# Patient Record
Sex: Male | Born: 1975 | Race: White | Hispanic: No | Marital: Married | Smoking: Never smoker
Health system: Southern US, Community
[De-identification: ages and names within clinical notes are randomized; demographics above are authoritative.]

## PROBLEM LIST (undated history)

## (undated) DIAGNOSIS — F329 Major depressive disorder, single episode, unspecified: Secondary | ICD-10-CM

## (undated) DIAGNOSIS — B0223 Postherpetic polyneuropathy: Secondary | ICD-10-CM

## (undated) DIAGNOSIS — F32A Depression, unspecified: Secondary | ICD-10-CM

---
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
## 2006-03-01 DIAGNOSIS — B0223 Postherpetic polyneuropathy: Secondary | ICD-10-CM

## 2006-03-01 HISTORY — DX: Postherpetic polyneuropathy: B02.23

## 2008-06-28 ENCOUNTER — Emergency Department (HOSPITAL_COMMUNITY): Admission: EM | Admit: 2008-06-28 | Discharge: 2008-06-28 | Payer: Self-pay | Admitting: Emergency Medicine

## 2008-07-15 ENCOUNTER — Encounter: Admission: RE | Admit: 2008-07-15 | Discharge: 2008-07-15 | Payer: Self-pay | Admitting: Internal Medicine

## 2010-08-17 ENCOUNTER — Emergency Department (HOSPITAL_COMMUNITY)
Admission: EM | Admit: 2010-08-17 | Discharge: 2010-08-18 | Disposition: A | Discharge status: Home / Self Care | Payer: PRIVATE HEALTH INSURANCE | Attending: Emergency Medicine | Admitting: Emergency Medicine

## 2010-08-17 DIAGNOSIS — I8010 Phlebitis and thrombophlebitis of unspecified femoral vein: Secondary | ICD-10-CM | POA: Insufficient documentation

## 2010-08-17 DIAGNOSIS — Z86718 Personal history of other venous thrombosis and embolism: Secondary | ICD-10-CM | POA: Insufficient documentation

## 2011-12-29 ENCOUNTER — Emergency Department (HOSPITAL_COMMUNITY)
Admission: EM | Admit: 2011-12-29 | Discharge: 2011-12-30 | Disposition: A | Discharge status: Home / Self Care | Payer: Self-pay | Attending: Emergency Medicine | Admitting: Emergency Medicine

## 2011-12-29 ENCOUNTER — Encounter (HOSPITAL_COMMUNITY): Payer: Self-pay

## 2011-12-29 DIAGNOSIS — H109 Unspecified conjunctivitis: Secondary | ICD-10-CM | POA: Insufficient documentation

## 2011-12-29 DIAGNOSIS — K1370 Unspecified lesions of oral mucosa: Secondary | ICD-10-CM | POA: Insufficient documentation

## 2011-12-29 DIAGNOSIS — R6884 Jaw pain: Secondary | ICD-10-CM | POA: Insufficient documentation

## 2011-12-29 DIAGNOSIS — Z79899 Other long term (current) drug therapy: Secondary | ICD-10-CM | POA: Insufficient documentation

## 2011-12-29 DIAGNOSIS — F329 Major depressive disorder, single episode, unspecified: Secondary | ICD-10-CM | POA: Insufficient documentation

## 2011-12-29 DIAGNOSIS — F3289 Other specified depressive episodes: Secondary | ICD-10-CM | POA: Insufficient documentation

## 2011-12-29 DIAGNOSIS — B029 Zoster without complications: Secondary | ICD-10-CM | POA: Insufficient documentation

## 2011-12-29 HISTORY — DX: Major depressive disorder, single episode, unspecified: F32.9

## 2011-12-29 HISTORY — DX: Postherpetic polyneuropathy: B02.23

## 2011-12-29 HISTORY — DX: Depression, unspecified: F32.A

## 2011-12-29 NOTE — ED Provider Notes (Signed)
History     CSN: [id number]  Arrival date & time 12/29/11  1951   First MD Initiated Contact with Patient 12/29/11 2312      Chief Complaint  Patient presents with  . Jaw Pain    right side  . Conjunctivitis    right eye  . Mouth Lesions    right bottom teeth    (Consider location/radiation/quality/duration/timing/severity/associated sxs/prior treatment) The history is provided by the patient. No language interpreter was used.   Cc: Patient reports pain in his right jaw for 4 days with  blisterlike lesions inside his mouth on the right cheek.  States that for the last 48 hours the pain has been intense and wakes him at night. Also his eye R is injected x 2 days but  painless. States that the blisters in his mouth feel like the shingles that he had on the L side of his face year ago.  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  Neuro intact.  Denies numbness or tingling.    Past Medical History  Diagnosis Date  . Shingles (herpes zoster) polyneuropathy 2008  . Depression     Past Surgical History  Procedure Date  . Appendectomy   . Tympanic membrane repair     No family history on file.  History  Substance Use Topics  . Smoking status: Never Smoker   . Smokeless tobacco: Not on file  . Alcohol Use: Yes     occasional 3x/week      Review of Systems  Constitutional: Negative.   HENT: Negative.  Negative for hearing loss, ear pain, sore throat, facial swelling, trouble swallowing, neck pain and neck stiffness.        Blisters inside R cheek  Eyes: Negative.   Respiratory: Negative.   Cardiovascular: Negative.   Gastrointestinal: Negative.   Neurological: Negative.   Psychiatric/Behavioral: Negative.   All other systems reviewed and are negative.    Allergies  Compazine  Home Medications   Current Outpatient Rx  Name Route Sig Dispense Refill  . BUPROPION HCL ER (XL) 150 MG PO TB24 Oral Take 150 mg by mouth daily.    . BUPROPION HCL ER (XL) 300  MG PO TB24 Oral Take 300 mg by mouth daily.    . IBUPROFEN 800 MG PO TABS Oral Take 800 mg by mouth every 8 (eight) hours as needed. FOR PAIN.    Marland Kitchen ADULT MULTIVITAMIN W/MINERALS CH Oral Take 1 tablet by mouth daily.    Marland Kitchen VISINE-LR OP Ophthalmic Apply 1 drop to eye as needed. FOR REDNESS      BP 127/71  Pulse 85  Temp 98.4 F (36.9 C) (Oral)  Resp 20  Ht 6\' 1"  (1.854 m)  Wt 233 lb (105.688 kg)  BMI 30.74 kg/m2  SpO2 97%  Physical Exam  Nursing note and vitals reviewed. Constitutional: He is oriented to person, place, and time. He appears well-developed and well-nourished.  HENT:  Head: Normocephalic.  Mouth/Throat: Uvula is midline, oropharynx is clear and moist and mucous membranes are normal.       4 blisters to R cheek  Eyes: Conjunctivae normal and EOM are normal. Pupils are equal, round, and reactive to light.  Neck: Normal range of motion. Neck supple.  Cardiovascular: Normal rate.   Pulmonary/Chest: Effort normal.  Abdominal: Soft.  Musculoskeletal: Normal range of motion.  Neurological: He is alert and oriented to person, place, and time.  Skin: Skin is warm and dry.  Psychiatric: He has a normal mood and affect.    ED Course  Procedures (including critical care time)  Labs Reviewed - No data to display No results found.   No diagnosis found.    MDM   36yo male with injected R eye and blisters to inside of R cheek with shingles like pain.  Rx for percocet, ibuprofen and acyclovir.  Will choose pcp from list to follow up with this week.or return to ER for worsening symptoms.  Neuro in tact. No weakness, numbness or tingling.  Patient is ready for discharge.       Remi Haggard, NP 12/30/11 1702

## 2011-12-29 NOTE — ED Notes (Signed)
Pt states that 2 days ago pt started having right jaw, face, throat and tongue pain, pt's right eye/sclera is red and pt states that he has 4 sores in his mouth on the right lower side near where his wisdom teeth used to be.  Pt states that it is painful to swallow. Pt has a hx of shingles (2008).

## 2011-12-30 MED ORDER — ACYCLOVIR 400 MG PO TABS: 400.0000 mg | ORAL_TABLET | Freq: Every day | ORAL | Status: DC

## 2011-12-30 MED ORDER — ERYTHROMYCIN 5 MG/GM OP OINT: TOPICAL_OINTMENT | OPHTHALMIC | Status: DC

## 2011-12-30 MED ORDER — OXYCODONE-ACETAMINOPHEN 5-325 MG PO TABS: 2.0000 | ORAL_TABLET | ORAL | Status: DC | PRN

## 2011-12-30 NOTE — ED Provider Notes (Signed)
Medical screening examination/treatment/procedure(s) were performed by non-physician practitioner and as supervising physician I was immediately available for consultation/collaboration.  Philena Obey, MD 12/30/11 2336 

## 2012-05-15 ENCOUNTER — Encounter (HOSPITAL_COMMUNITY): Payer: Self-pay | Admitting: Emergency Medicine

## 2012-05-15 ENCOUNTER — Emergency Department (HOSPITAL_COMMUNITY)
Admission: EM | Admit: 2012-05-15 | Discharge: 2012-05-15 | Disposition: A | Discharge status: Home / Self Care | Payer: BC Managed Care – PPO | Source: Home / Self Care | Attending: Emergency Medicine | Admitting: Emergency Medicine

## 2012-05-15 DIAGNOSIS — M7660 Achilles tendinitis, unspecified leg: Secondary | ICD-10-CM

## 2012-05-15 MED ORDER — METHYLPREDNISOLONE ACETATE 80 MG/ML IJ SUSP
80.0000 mg | Freq: Once | INTRAMUSCULAR | Status: AC
  Administered 2012-05-15: 80 mg via INTRAMUSCULAR

## 2012-05-15 MED ORDER — HYDROCODONE-ACETAMINOPHEN 5-325 MG PO TABS: ORAL_TABLET | ORAL | Status: DC

## 2012-05-15 MED ORDER — KETOROLAC TROMETHAMINE 60 MG/2ML IM SOLN
INTRAMUSCULAR | Status: AC
  Filled 2012-05-15: qty 2

## 2012-05-15 MED ORDER — METHYLPREDNISOLONE ACETATE 80 MG/ML IJ SUSP
INTRAMUSCULAR | Status: AC
  Filled 2012-05-15: qty 1

## 2012-05-15 MED ORDER — KETOROLAC TROMETHAMINE 60 MG/2ML IM SOLN
60.0000 mg | Freq: Once | INTRAMUSCULAR | Status: AC
  Administered 2012-05-15: 60 mg via INTRAMUSCULAR

## 2012-05-15 MED ORDER — DICLOFENAC SODIUM 75 MG PO TBEC: 75.0000 mg | DELAYED_RELEASE_TABLET | Freq: Two times a day (BID) | ORAL | Status: DC

## 2012-05-15 NOTE — ED Provider Notes (Signed)
Chief Complaint:   Chief Complaint  Patient presents with  . Foot Pain    History of Present Illness:   Evan Romero is a 37 year old male who has had a one-month history of intermittent left Achilles tendon pain. The pain is located at the insertion of the Achilles on the calcaneus. This first occurred a month ago and he saw an urgent care physician in [place] who prescribed meloxicam. The pain eventually got better, then came back 3 days ago and it hurts to dorsiflex the foot. There is perhaps a little bit of swelling and erythema in the area. He denies any injury or twisting motion of the ankle or the foot. He has no history of joint pain anywhere else and no history of gout.  Review of Systems:  Other than noted above, the patient denies any of the following symptoms: Systemic:  No fevers, chills, sweats, or aches.  No fatigue or tiredness. Musculoskeletal:  No joint pain, arthritis, bursitis, swelling, back pain, or neck pain. Neurological:  No muscular weakness, paresthesias, headache, or trouble with speech or coordination.  No dizziness.  PMFSH:  Past medical history, family history, social history, meds, and allergies were reviewed.  He is allergic to Compazine and takes Wellbutrin. He has no medical illnesses.  Physical Exam:   Vital signs:  BP 129/85  Pulse 93  Temp(Src) 98.4 F (36.9 C) (Oral)  Resp 18  SpO2 99% Gen:  Alert and oriented times 3.  In no distress. Musculoskeletal: Slight swelling and erythema at the insertion of the Achilles on the calcaneus. It's exquisitely tender to palpation at this area. He has pain with dorsiflexion of the foot but not plantar flexion.  Otherwise, all joints had a full a ROM with no swelling, bruising or deformity.  No edema, pulses full. Extremities were warm and pink.  Capillary refill was brisk.  Skin:  Clear, warm and dry.  No rash. Neuro:  Alert and oriented times 3.  Muscle strength was normal.  Sensation was intact to light touch.    Course in Urgent Care Center:   He was given Toradol 60 mg IM, Depo-Medrol 80 mg IM, and placed in a Cam Walker boot.  Assessment:  The encounter diagnosis was Achilles tendonitis, left.  He has fairly typical Achilles tendinitis. He will need followup, and I recommended Dr. Roanna Epley. No strenuous activity in the meantime. He is to remain in the [employer] whenever he is up and about.  Plan:   1.  The following meds were prescribed:   New Prescriptions   DICLOFENAC (VOLTAREN) 75 MG EC TABLET    Take 1 tablet (75 mg total) by mouth 2 (two) times daily.   HYDROCODONE-ACETAMINOPHEN (NORCO/VICODIN) 5-325 MG PER TABLET    1 to 2 tabs every 4 to 6 hours as needed for pain.   2.  The patient was instructed in symptomatic care, including rest and activity, elevation, application of ice and compression.  Appropriate handouts were given. 3.  The patient was told to return if becoming worse in any way, if no better in 3 or 4 days, and given some red flag symptoms such as fever, worsening pain, or difficulty ambulating that would indicate earlier return.   4.  The patient was told to follow up with Dr. Darrick Penna in one week.    Reuben Likes, MD 05/15/12 [phone]

## 2012-05-15 NOTE — ED Notes (Signed)
Pt is here for pain on left heel since yest Reports having similar sx in the past and was dx w/Achilles tendonitis Pain increases w/pressure and acitivty Denies: inj/trauma Took ibuprofen this am w/little relief  He is alert and oriented w/no signs of acute distress.

## 2013-07-02 ENCOUNTER — Other Ambulatory Visit: Payer: Self-pay | Admitting: *Deleted

## 2013-07-02 DIAGNOSIS — I83893 Varicose veins of bilateral lower extremities with other complications: Secondary | ICD-10-CM

## 2013-07-09 ENCOUNTER — Encounter: Payer: Self-pay | Admitting: Vascular Surgery

## 2013-07-10 ENCOUNTER — Encounter: Payer: Self-pay | Admitting: Vascular Surgery

## 2013-07-10 ENCOUNTER — Encounter (INDEPENDENT_AMBULATORY_CARE_PROVIDER_SITE_OTHER): Payer: Self-pay

## 2013-07-10 ENCOUNTER — Ambulatory Visit (INDEPENDENT_AMBULATORY_CARE_PROVIDER_SITE_OTHER): Payer: BC Managed Care – PPO | Admitting: Vascular Surgery

## 2013-07-10 ENCOUNTER — Ambulatory Visit (HOSPITAL_COMMUNITY)
Admission: RE | Admit: 2013-07-10 | Discharge: 2013-07-10 | Disposition: A | Discharge status: Home / Self Care | Payer: BC Managed Care – PPO | Source: Ambulatory Visit | Attending: Vascular Surgery | Admitting: Vascular Surgery

## 2013-07-10 VITALS — BP 116/76 | HR 81 | Ht 73.0 in | Wt 225.0 lb

## 2013-07-10 DIAGNOSIS — I83893 Varicose veins of bilateral lower extremities with other complications: Secondary | ICD-10-CM | POA: Insufficient documentation

## 2013-07-10 NOTE — Progress Notes (Signed)
Subjective:     Patient ID: Evan BoydenJason Goshert, male   DOB: 07/06/1975, 38 y.o.   MRN: [id number]  HPI this 38 year old male was evaluated for painful varicosities in the right leg. A few years ago he had laser ablation of the proximal right great saphenous vein performed in [place]. This relieved some discomfort in the proximal thigh but he has continued to have aching throbbing and burning discomfort in the calf and ankle area with bulging varicosities. He has chronic edema in the right ankle. He has tried lastly compression stockings in the past but not recently. He has no history of DVT or thrombophlebitis. His symptoms continued to worsen. He also has noticed some patches of spider veins which are progressing in the ankle area.  Past Medical History  Diagnosis Date  . Shingles (herpes zoster) polyneuropathy 2008  . Depression     History  Substance Use Topics  . Smoking status: Never Smoker   . Smokeless tobacco: Not on file  . Alcohol Use: 3.6 oz/week    6 Cans of beer per week     Comment: occasional 3x/week    Family History  Problem Relation Age of Onset  . Cancer Mother     breast cancer  . Diabetes Mother   . Varicose Veins Father     Allergies  Allergen Reactions  . Compazine [Prochlorperazine Edisylate]     Liver stops functioning    Current outpatient prescriptions:buPROPion (WELLBUTRIN XL) 150 MG 24 hr tablet, Take 150 mg by mouth daily., Disp: , Rfl: ;  buPROPion (WELLBUTRIN XL) 300 MG 24 hr tablet, Take 300 mg by mouth daily., Disp: , Rfl: ;  ibuprofen (ADVIL,MOTRIN) 800 MG tablet, Take 800 mg by mouth every 8 (eight) hours as needed. FOR PAIN., Disp: , Rfl: ;  Multiple Vitamin (MULTIVITAMIN WITH MINERALS) TABS, Take 1 tablet by mouth daily., Disp: , Rfl:  acyclovir (ZOVIRAX) 400 MG tablet, Take 1 tablet (400 mg total) by mouth 5 (five) times daily., Disp: 50 tablet, Rfl: 0;  diclofenac (VOLTAREN) 75 MG EC tablet, Take 1 tablet (75 mg total) by mouth 2 (two) times daily.,  Disp: 30 tablet, Rfl: 2;  erythromycin ophthalmic ointment, Place into the right eye every 4 (four) hours., Disp: 3.5 g, Rfl: 0 HYDROcodone-acetaminophen (NORCO/VICODIN) 5-325 MG per tablet, 1 to 2 tabs every 4 to 6 hours as needed for pain., Disp: 20 tablet, Rfl: 0;  oxyCODONE-acetaminophen (PERCOCET/ROXICET) 5-325 MG per tablet, Take 2 tablets by mouth every 4 (four) hours as needed for pain., Disp: 15 tablet, Rfl: 0;  Oxymetazoline HCl (VISINE-LR OP), Apply 1 drop to eye as needed. FOR REDNESS, Disp: , Rfl:   BP 116/76  Pulse 81  Ht 6\' 1"  (1.854 m)  Wt 225 lb (102.059 kg)  BMI 29.69 kg/m2  SpO2 100%  Body mass index is 29.69 kg/(m^2).           Review of Systems denies chest pain, dyspnea on exertion, PND, orthopnea, hemoptysis, claudication, lateralizing weakness. All systems negative and a complete review of systems    Objective:   Physical Exam BP 116/76  Pulse 81  Ht 6\' 1"  (1.854 m)  Wt 225 lb (102.059 kg)  BMI 29.69 kg/m2  SpO2 100%  Gen.-alert and oriented x3 in no apparent distress HEENT normal for age Lungs no rhonchi or wheezing Cardiovascular regular rhythm no murmurs carotid pulses 3+ palpable no bruits audible Abdomen soft nontender no palpable masses Musculoskeletal free of  major deformities Skin clear -no rashes  Neurologic normal Lower extremities 3+ femoral and dorsalis pedis pulses palpable bilaterally with no edema on left 1+ edema on the right Bulging varicosities right medial calf over great saphenous system with network of reticular and spider veins surrounding medial malleolus on the right and lateral malleolus. No active ulceration noted.  Today I ordered a venous duplex exam of the right leg which are reviewed and interpreted. There is no DVT. Right saphenous vein is closed proximally near the saphenofemoral junction. However the remainder of the great saphenous vein from the proximal thigh to the proximal calf is patent with gross reflux N. of  significant size and that supplies the bulging varicosities in the right leg. There is reflux in the right small saphenous vein but it is a smaller caliber vein. There is no DVT.       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 Second patient's daily living causing pain and edema    Plan:         #1 long leg elastic compression stockings 20-30 mm gradient #2 elevate legs as much as possible #3 ibuprofen daily on a regular basis for pain #4 return in 3 months-if no significant improvement then he will need laser ablation right great saphenous vein from proximal calf to proximal thigh +10-20 stab phlebectomy. He will then be followed and likely will require sclerotherapy later date

## 2013-10-22 ENCOUNTER — Encounter: Payer: Self-pay | Admitting: Vascular Surgery

## 2013-10-23 ENCOUNTER — Ambulatory Visit: Payer: BC Managed Care – PPO | Admitting: Vascular Surgery

## 2013-11-02 ENCOUNTER — Encounter: Payer: Self-pay | Admitting: Vascular Surgery

## 2013-11-06 ENCOUNTER — Encounter: Payer: Self-pay | Admitting: Vascular Surgery

## 2013-11-06 ENCOUNTER — Ambulatory Visit (INDEPENDENT_AMBULATORY_CARE_PROVIDER_SITE_OTHER): Payer: Managed Care, Other (non HMO) | Admitting: Vascular Surgery

## 2013-11-06 VITALS — BP 145/85 | HR 76 | Resp 16 | Ht 73.0 in | Wt 225.0 lb

## 2013-11-06 DIAGNOSIS — I83893 Varicose veins of bilateral lower extremities with other complications: Secondary | ICD-10-CM

## 2013-11-06 NOTE — Progress Notes (Signed)
Subjective:     Patient ID: Evan Romero, male   DOB: [birth year], 38 y.o.   MRN: [id number]  HPI this 38 year old male returns for continued followup regarding his painful varicosities in the right leg. He also has severe throbbing and itching discomfort particularly in the calf and ankle. He has tried along with elastic compression stockings 20-30 mm gradient as well as elevation and ibuprofen with no success. It is affecting his daily living and ability to work.  Past Medical History  Diagnosis Date  . Shingles (herpes zoster) polyneuropathy 2008  . Depression     History  Substance Use Topics  . Smoking status: Never Smoker   . Smokeless tobacco: Not on file  . Alcohol Use: 3.6 oz/week    6 Cans of beer per week     Comment: occasional 3x/week    Family History  Problem Relation Age of Onset  . Cancer Mother     breast cancer  . Diabetes Mother   . Varicose Veins Father     Allergies  Allergen Reactions  . Compazine [Prochlorperazine Edisylate]     Liver stops functioning    Current outpatient prescriptions:acyclovir (ZOVIRAX) 400 MG tablet, Take 1 tablet (400 mg total) by mouth 5 (five) times daily., Disp: 50 tablet, Rfl: 0;  buPROPion (WELLBUTRIN XL) 150 MG 24 hr tablet, Take 150 mg by mouth daily., Disp: , Rfl: ;  buPROPion (WELLBUTRIN XL) 300 MG 24 hr tablet, Take 300 mg by mouth daily., Disp: , Rfl:  diclofenac (VOLTAREN) 75 MG EC tablet, Take 1 tablet (75 mg total) by mouth 2 (two) times daily., Disp: 30 tablet, Rfl: 2;  erythromycin ophthalmic ointment, Place into the right eye every 4 (four) hours., Disp: 3.5 g, Rfl: 0;  HYDROcodone-acetaminophen (NORCO/VICODIN) 5-325 MG per tablet, 1 to 2 tabs every 4 to 6 hours as needed for pain., Disp: 20 tablet, Rfl: 0 ibuprofen (ADVIL,MOTRIN) 800 MG tablet, Take 800 mg by mouth every 8 (eight) hours as needed. FOR PAIN., Disp: , Rfl: ;  Multiple Vitamin (MULTIVITAMIN WITH MINERALS) TABS, Take 1 tablet by mouth daily., Disp: , Rfl: ;   oxyCODONE-acetaminophen (PERCOCET/ROXICET) 5-325 MG per tablet, Take 2 tablets by mouth every 4 (four) hours as needed for pain., Disp: 15 tablet, Rfl: 0 Oxymetazoline HCl (VISINE-LR OP), Apply 1 drop to eye as needed. FOR REDNESS, Disp: , Rfl:   BP 145/85  Pulse 76  Resp 16  Ht  (1.854 m)  Wt 225 lb (102.059 kg)  BMI 29.69 kg/m2  Body mass index is 29.69 kg/(m^2).           Review of Systems denies chest pain, dyspnea on exertion, PND, orthopnea, hemoptysis.    Objective:   Physical Exam BP 145/85  Pulse 76  Resp 16  Ht  (1.854 m)  Wt 225 lb (102.059 kg)  BMI 29.69 kg/m2  General well-developed well-nourished male in no apparent stress alert and oriented x3 Lungs no rhonchi or wheezing Right leg with bulging varicosities beginning in the distal thigh medially extending into the medial calf with extensive reticular spider network around the right medial malleolus with no active ulceration and early hyperpigmentation. 3 posterior cells pedis pulse palpable.     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 We'll proceed with pre-certification to perform this in the near future

## 2013-11-20 ENCOUNTER — Other Ambulatory Visit: Payer: Self-pay | Admitting: *Deleted

## 2013-11-20 DIAGNOSIS — I83893 Varicose veins of bilateral lower extremities with other complications: Secondary | ICD-10-CM

## 2013-11-30 ENCOUNTER — Encounter: Payer: Self-pay | Admitting: Vascular Surgery

## 2013-12-03 ENCOUNTER — Encounter: Payer: Self-pay | Admitting: Vascular Surgery

## 2013-12-03 ENCOUNTER — Ambulatory Visit (INDEPENDENT_AMBULATORY_CARE_PROVIDER_SITE_OTHER): Payer: Managed Care, Other (non HMO) | Admitting: Vascular Surgery

## 2013-12-03 VITALS — BP 119/67 | HR 77 | Resp 16 | Ht 72.0 in | Wt 235.0 lb

## 2013-12-03 DIAGNOSIS — I83891 Varicose veins of right lower extremities with other complications: Secondary | ICD-10-CM

## 2013-12-03 DIAGNOSIS — I83899 Varicose veins of unspecified lower extremities with other complications: Secondary | ICD-10-CM | POA: Insufficient documentation

## 2013-12-03 NOTE — Progress Notes (Signed)
   Laser Ablation Procedure      Date: 12/03/2013    Evan Romero DOB:12/12/1975  Consent signed: Yes  Surgeon:J.D. Lawson  Procedure: Laser Ablation: right Greater Saphenous Vein  BP 119/67  Pulse 77  Resp 16  Ht 6' (1.829 m)  Wt 235 lb (106.595 kg)  BMI 31.86 kg/m2  Start time: 1pm   End time: 2pm  Tumescent Anesthesia: 300 cc 0.9% NaCl with 50 cc Lidocaine HCL with 1% Epi and 15 cc 8.4% NaHCO3  Local Anesthesia: 6 cc Lidocaine HCL and NaHCO3 (ratio 2:1)  Pulsed mode:1TRen84.0TurkmenKentuckyEye Surgery CentLaurelyn Sic82GeorginCGaGeorgiTRen(64.0TurkmenKentuckyAdvocate Christ Hospital & Medical CentLaurelyn Sic82GeorginCGaGeorgiTRen74.0TurkmenKentuckyAdvanced Surgery Center Of [facility] LLaurelyn Sic82GeorginCGaGeorgiTRe4.0TurkmenKentuckySsm Health [facility] Laurelyn Sic82GeorginCGaGeorTRen24.0TurkmenKentuckyColonial Outpatient Surgery CentLaurelyn Sic82GeorginCGaGeorgiAltames ETRen94.0TurkmenSt Mary'S Medical CentLaurelyn SicGaGeorgiTRen(54.0TurkmenKentuckyAdventhealth SebriLaurelyn Sic82GeorginCGaGeorgiTRe4.0TurkmenKentuckyReeves County HospitLaurelyn Sic82GeorginCGaGeorgiAlTRen64.0TurkmenKentuckyRolling Hills HospitLaurelyn Sic82GeorginCGaGeorgiTRen(94.0TurkmenKentuckyHenry J. Carter Specialty HospitLaurelyn Sic82GeorginCGaGeorgATRen54.0TurkmenKentuckyPhysicians Surgicenter LLaurelyn Sic82GeorginCGaGeorgiTRe4.0TurkmenKentuckyCataract And Laser Center Of [facility] Centeral Laurelyn Sic82GeorginCGaGeorgiATRen64.0TurkmenKentuckyKona Ambulatory Surgery Center LLaurelyn Sic82GeorginCGaGeorgiTRen94.0TurkmenKentuckySt Marys HospitLaurelyn Sic82GeorginCGaGeorgiTRen84.0TurkmenKentuckyAdventist Health Frank [facility] Howard Memorial HospitLaurelyn Sic82GeorginCGaGeorgiAltames Ren(64.0TurkmLaurelyn Sic82GeorGaGeoATRen(84.0TurkmenKentuckyAppleton Municipal HospitLaurelyn Sic82GeorginCGaGeorgiATRen(414.0TurkmenKentuckyBanner Behavioral Health HospitLaurelyn Sic82GeorginCGaGeorgiTRen94.0TurkmenKentuckyAdventist Medical Center-SelLaurelyn Sic82GeorginCGaGeorgiAltames ETRen(434.0TurkmenSouthwestern Endoscopy Center LLaurelyn SicGaGeorgiTRen24.0TurkmenKentuckyColiseum Medical CenteLaurelyn Sic82GeorginCGaGeorgAlTRen94.0TurkmenKentuckyCarepartners Rehabilitation HospitLaurelyn Sic82GeorginCGaGeorgiATRe4.0TurkmenKentuckyInova Ambulatory Surgery [facility] LLaurelyn Sic82GeorginCGaGeorgiTRen94.0TurkmenKentuckyBanner Estrella Medical CentLaurelyn Sic82GeorginCGaGeorgiAltameseLEngineer, sitestos Asan4United Parcelgy: 1297, Total pulses: 87, Total time: 1:26     Stab Phlebectomy: 10-20 Sites: Calf  Patient tolerated procedure well: Yes  Notes:   Description of Procedure:  After marking the course of the secondary varicosities, the patient was placed on the operating table in the supine position, and the right leg was prepped and draped in sterile fashion.   Local anesthetic was administered and under ultrasound guidance the saphenous vein was accessed with a micro needle and guide wire; then the mirco puncture sheath was place.  A guide wire was inserted to the top of the vein, followed by a 5 french sheath.  The position of the sheath and then the laser fiber  was confirmed using the ultrasound.  Tumescent anesthesia was administered along the course of the saphenous vein using ultrasound guidance. The patient was placed in Trendelenburg position and protective laser glasses were placed on patient and staff, and the laser was fired at 15 watt pulsed mode advancing 1-2 mm per sec for a total of 1297 joules.   For stab phlebectomies, local anesthetic was administered at the previously marked varicosities, and tumescent anesthesia was administered around the vessels.  Ten to 20 stab wounds were made using the tip of an 11 blade. And using the vein hook, the phlebectomies were performed using a hemostat to avulse the varicosities.  Adequate hemostasis was achieved.     Steri  strips were applied to the stab wounds and ABD pads and thigh high compression stockings were applied.  Ace wrap bandages were applied over the phlebectomy sites and at the top of the saphenous vein. Blood loss was less than 15 cc.  The patient ambulated out of the operating room having tolerated the procedure well.

## 2013-12-03 NOTE — Progress Notes (Signed)
Subjective:     Patient ID: Evan Romero, male   DOB: 10/26/1975, 38 y.o.   MRN: [id number]  HPI this 38 year old male had laser ablation of the right great saphenous vein from the mid calf to mid thigh plus multiple stab phlebectomy of painful varicosities primarily in the calf area performed under local tumescent anesthesia. A total of 1300 J of energy was utilized. He tolerated the change well.  Review of Systems     Objective:   Physical Exam BP 119/67  Pulse 77  Resp 16  Ht 6' (1.829 m)  Wt 235 lb (106.595 kg)  BMI 31.86 kg/m2       Assessment:     Well-tolerated laser ablation right great saphenous vein from mid calf to mid thigh performed under local tumescent anesthesia plus multiple stab phlebectomy of painful varicosities Patient previously had had laser ablation of the proximal right great saphenous vein from midthigh to near the saphenofemoral junction.    Plan:     Patient to return in one week for venous duplex exam to confirm closure of the remainder of the right great saphenous vein

## 2013-12-04 ENCOUNTER — Telehealth: Payer: Self-pay | Admitting: *Deleted

## 2013-12-04 NOTE — Telephone Encounter (Signed)
Pt had no bleeding from stab sites. Had some pain last night but doing well this am. Reminded him of his fu appt. Following all instructions.

## 2013-12-07 ENCOUNTER — Encounter: Payer: Self-pay | Admitting: Vascular Surgery

## 2013-12-10 ENCOUNTER — Ambulatory Visit (HOSPITAL_COMMUNITY)
Admission: RE | Admit: 2013-12-10 | Discharge: 2013-12-10 | Disposition: A | Discharge status: Home / Self Care | Payer: Managed Care, Other (non HMO) | Source: Ambulatory Visit | Attending: Vascular Surgery | Admitting: Vascular Surgery

## 2013-12-10 ENCOUNTER — Ambulatory Visit (INDEPENDENT_AMBULATORY_CARE_PROVIDER_SITE_OTHER): Payer: Managed Care, Other (non HMO) | Admitting: Vascular Surgery

## 2013-12-10 ENCOUNTER — Encounter: Payer: Self-pay | Admitting: Vascular Surgery

## 2013-12-10 VITALS — BP 111/73 | HR 68 | Resp 16 | Ht 72.0 in | Wt 235.0 lb

## 2013-12-10 DIAGNOSIS — I8393 Asymptomatic varicose veins of bilateral lower extremities: Secondary | ICD-10-CM | POA: Diagnosis not present

## 2013-12-10 DIAGNOSIS — I83891 Varicose veins of right lower extremities with other complications: Secondary | ICD-10-CM

## 2013-12-10 NOTE — Progress Notes (Signed)
Subjective:     Patient ID: Evan BoydenJason Pfannenstiel, male   DOB: 11/09/1975, 38 y.o.   MRN: [id number]  HPI this 38 year old male returns 1 week post laser oblation right great saphenous vein from distal thigh to mid thigh plus multiple stab phlebectomy of painful varicosities. He had previously undergone laser ablation of the right proximal great saphenous vein in [place] 2 years ago. He has had no chest pain or shortness of breath. He has had some mild-to-moderate discomfort along the course of the laser ablation. There's been no pain in the stab phlebectomy sites or distal edema.  Review of Systems     Objective:   Physical Exam BP 111/73  Pulse 68  Resp 16  Ht 6' (1.829 m)  Wt 235 lb (106.595 kg)  BMI 31.86 kg/m2  General well-developed well-nourished male no apparent stress alert and oriented x3 Mild tenderness along the course of the great saphenous vein which is very subcutaneous in location. Well-healed stab phlebectomy sites in And distal thigh and no distal edema noted.  Lower venous duplex exam of the right leg which I reviewed and interpreted. There is no DVT. There is a subcutaneous portion of the great saphenous vein which is totally occluded from the laser ablation as well as the proximal right saphenous vein. Some of the distal great saphenous vein in the thigh is opened communicating with an incompetent perforating branch.      Assessment:     Successful laser ablation minor of right great saphenous vein from distal thigh to mid thigh plus multiple stab phlebectomy of painful varicosities    Plan:     The patient will undergo sclerotherapy as final step of his treatment regimen. This will be done in the next few weeks he will return to see us on when necessary basis

## 2014-02-12 ENCOUNTER — Encounter: Payer: Self-pay | Admitting: Vascular Surgery

## 2014-02-13 ENCOUNTER — Ambulatory Visit (INDEPENDENT_AMBULATORY_CARE_PROVIDER_SITE_OTHER): Payer: Managed Care, Other (non HMO) | Admitting: *Deleted

## 2014-02-13 DIAGNOSIS — I83891 Varicose veins of right lower extremities with other complications: Secondary | ICD-10-CM

## 2014-02-13 NOTE — Progress Notes (Signed)
X=.3% Sotradecol administered with a 27g butterfly.  Patient received a total of 12cc.  Treated all areas of concern on the right leg. Tol well. Easy access. Anticpate good results. Follow prn.  Photos: No.  Compression stockings applied: Yes.

## 2014-04-07 ENCOUNTER — Encounter (HOSPITAL_COMMUNITY): Payer: Self-pay | Admitting: *Deleted

## 2014-04-07 ENCOUNTER — Emergency Department (HOSPITAL_COMMUNITY)
Admission: EM | Admit: 2014-04-07 | Discharge: 2014-04-07 | Disposition: A | Discharge status: Home / Self Care | Payer: BLUE CROSS/BLUE SHIELD | Source: Home / Self Care | Attending: Emergency Medicine | Admitting: Emergency Medicine

## 2014-04-07 DIAGNOSIS — R195 Other fecal abnormalities: Secondary | ICD-10-CM

## 2014-04-07 DIAGNOSIS — A09 Infectious gastroenteritis and colitis, unspecified: Secondary | ICD-10-CM

## 2014-04-07 MED ORDER — DIPHENOXYLATE-ATROPINE 2.5-0.025 MG PO TABS: 1.0000 | ORAL_TABLET | Freq: Four times a day (QID) | ORAL | Status: DC | PRN

## 2014-04-07 NOTE — ED Notes (Signed)
Pt. unable to obtain stool sample, only gas. Pt. did drink all of gatorade.  NP notified.

## 2014-04-07 NOTE — Discharge Instructions (Signed)
Fecal Occult Blood Test This is a test done on a stool specimen to screen for gastrointestinal bleeding, which may be an indicator of colon cancer Is is usually done as part of a routine examination, annually, after age 40 or as directed by your caregiver. The fecal occult blood test (FOBT) checks for blood in your stool. Normally, there will not be enough blood lost through the gastrointestinal tract to turn an FOBT positive or for you to notice it visually in the form of bloody or dark, tarry stools. Any significant amount of blood being passed should be investigated.  A positive FOBT will tell your caregiver that you have bleeding occurring somewhere in your gastrointestinal tract. This blood loss could be due to ulcers, diverticulosis, bleeding polyps, inflammatory bowel disease, hemorrhoids, from swallowed blood due to bleeding gums or nosebleeds, or it could be due to benign or cancerous tumors. Anything that protrudes into the lumen (the empty space in the intestine), like a polyp or tumor, and is rubbed against by the fecal waste as it passes through has the potential to eventually bleed intermittently. Often this small amount of blood is the first, and sometimes the only, symptom of early colon cancer, making the FOBT a valuable screening tool. PREPARATION FOR TEST  You should not eat red meat within three days before testing. Other substances that could cause a false positive test result include fish, turnips, horseradish, and drugs such as colchicines and oxidizing drugs (for example, iodine and boric acid). Be sure to carefully follow your caregiver's instructions. With FOBT, your caregiver or laboratory will give you one or more test "cards." You collect a separate sample from three different stools, usually on consecutive days. Each stool sample should be collected into a clean container and should not be contaminated with urine or water. The slide is labeled with your name and the date; then,  with an applicator stick, you apply a thin smear of stool onto each filter paper square/window contained on the card. Allow the filter paper to dry. Once it is dry, it is stable. Usually you will collect all of the consecutive samples, and then return all of them to your caregiver or laboratory at the same time, sometimes by mailing them. There are also over the counter tests which are dropped in your toilet. NORMAL FINDINGS   No occult blood within the stool.  The FOBT test is normally negative. A positive indicates either blood in the stool or an interfering substance. Multiple samples are done to: 1) catch intermittent bleeding; and 2) help rule out false positives. Ranges for normal findings may vary among different laboratories and hospitals. You should always check with your doctor after having lab work or other tests done to discuss the meaning of your test results and whether your values are considered within normal limits. MEANING OF TEST  Your caregiver will go over the test results with you and discuss the importance and meaning of your results, as well as treatment options and the need for additional tests if necessary. OBTAINING THE TEST RESULTS  It is your responsibility to obtain your test results. Ask the lab or department performing the test when and how you will get your results. Document Released: 03/12/2004 Document Revised: 05/10/2011 Document Reviewed: 01/26/2008 [facility] Patient Information 2015 [place], [place]. This information is not intended to replace advice given to you by your health care provider. Make sure you discuss any questions you have with your health care provider. Diarrhea Diarrhea is frequent loose  and watery bowel movements. It can cause you to feel weak and dehydrated. Dehydration can cause you to become tired and thirsty, have a dry mouth, and have decreased urination that often is dark yellow. Diarrhea is a sign of another problem, most often an infection  that will not last long. In most cases, diarrhea typically lasts 2-3 days. However, it can last longer if it is a sign of something more serious. It is important to treat your diarrhea as directed by your caregiver to lessen or prevent future episodes of diarrhea. CAUSES  Some common causes include:  Gastrointestinal infections caused by viruses, bacteria, or parasites.  Food poisoning or food allergies.  Certain medicines, such as antibiotics, chemotherapy, and laxatives.  Artificial sweeteners and fructose.  Digestive disorders. HOME CARE INSTRUCTIONS  Ensure adequate fluid intake (hydration): Have 1 cup (8 oz) of fluid for each diarrhea episode. Avoid fluids that contain simple sugars or sports drinks, fruit juices, whole milk products, and sodas. Your urine should be clear or pale yellow if you are drinking enough fluids. Hydrate with an oral rehydration solution that you can purchase at pharmacies, retail stores, and online. You can prepare an oral rehydration solution at home by mixing the following ingredients together:   - tsp table salt.   tsp baking soda.   tsp salt substitute containing potassium chloride.  1  tablespoons sugar.  1 L (34 oz) of water.  Certain foods and beverages may increase the speed at which food moves through the gastrointestinal (GI) tract. These foods and beverages should be avoided and include:  Caffeinated and alcoholic beverages.  High-fiber foods, such as raw fruits and vegetables, nuts, seeds, and whole grain breads and cereals.  Foods and beverages sweetened with sugar alcohols, such as xylitol, sorbitol, and mannitol.  Some foods may be well tolerated and may help thicken stool including:  Starchy foods, such as rice, toast, pasta, low-sugar cereal, oatmeal, grits, baked potatoes, crackers, and bagels.  Bananas.  Applesauce.  Add probiotic-rich foods to help increase healthy bacteria in the GI tract, such as yogurt and fermented  milk products.  Wash your hands well after each diarrhea episode.  Only take over-the-counter or prescription medicines as directed by your caregiver.  Take a warm bath to relieve any burning or pain from frequent diarrhea episodes. SEEK IMMEDIATE MEDICAL CARE IF:   You are unable to keep fluids down.  You have persistent vomiting.  You have blood in your stool, or your stools are black and tarry.  You do not urinate in 6-8 hours, or there is only a small amount of very dark urine.  You have abdominal pain that increases or localizes.  You have weakness, dizziness, confusion, or light-headedness.  You have a severe headache.  Your diarrhea gets worse or does not get better.  You have a fever or persistent symptoms for more than 2-3 days.  You have a fever and your symptoms suddenly get worse. MAKE SURE YOU:   Understand these instructions.  Will watch your condition.  Will get help right away if you are not doing well or get worse. Document Released: 02/05/2002 Document Revised: 07/02/2013 Document Reviewed: 10/24/2011 [facility] Patient Information 2015 [place]. This information is not intended to replace advice given to you by your health care provider. Make sure you discuss any questions you have with your health care provider. Bloody Stools Bloody stools often mean that there is a problem in the digestive tract. Your caregiver may use the term "  melena" to describe black, tarry, and bad smelling stools or "hematochezia" to describe red or maroon-colored stools. Blood seen in the stool can be caused by bleeding anywhere along the intestinal tract.  A black stool usually means that blood is coming from the upper part of the gastrointestinal tract (esophagus, stomach, or small bowel). Passing maroon-colored stools or bright red blood usually means that blood is coming from lower down in the large bowel or the rectum. However, sometimes massive bleeding in the stomach or  small intestine can cause bright red bloody stools.  Consuming black licorice, lead, iron pills, medicines containing bismuth subsalicylate, or blueberries can also cause black stools. Your caregiver can test black stools to see if blood is present. It is important that the cause of the bleeding be found. Treatment can then be started, and the problem can be corrected. Rectal bleeding may not be serious, but you should not assume everything is okay until you know the cause.It is very important to follow up with your caregiver or a specialist in gastrointestinal problems. CAUSES  Blood in the stools can come from various underlying causes.Often, the cause is not found during your first visit. Testing is often needed to discover the cause of bleeding in the gastrointestinal tract. Causes range from simple to serious or even life-threatening.Possible causes include:  Hemorrhoids.These are veins that are full of blood (engorged) in the rectum. They cause pain, inflammation, and may bleed.  Anal fissures.These are areas of painful tearing which may bleed. They are often caused by passing hard stool.  Diverticulosis.These are pouches that form on the colon over time, with age, and may bleed significantly.  Diverticulitis.This is inflammation in areas with diverticulosis. It can cause pain, fever, and bloody stools, although bleeding is rare.  Proctitis and colitis. These are inflamed areas of the rectum or colon. They may cause pain, fever, and bloody stools.  Polyps and cancer. Colon cancer is a leading cause of preventable cancer death.It often starts out as precancerous polyps that can be removed during a colonoscopy, preventing progression into cancer. Sometimes, polyps and cancer may cause rectal bleeding.  Gastritis and ulcers.Bleeding from the upper gastrointestinal tract (near the stomach) may travel through the intestines and produce black, sometimes tarry, often bad smelling stools. In  certain cases, if the bleeding is fast enough, the stools may not be black, but red and the condition may be life-threatening. SYMPTOMS  You may have stools that are bright red and bloody, that are normal color with blood on them, or that are dark black and tarry. In some cases, you may only have blood in the toilet bowl. Any of these cases need medical care. You may also have:  Pain at the anus or anywhere in the rectum.  Lightheadedness or feeling faint.  Extreme weakness.  Nausea or vomiting.  Fever. DIAGNOSIS Your caregiver may use the following methods to find the cause of your bleeding:  Taking a medical history. Age is important. Older people tend to develop polyps and cancer more often. If there is anal pain and a hard, large stool associated with bleeding, a tear of the anus may be the cause. If blood drips into the toilet after a bowel movement, bleeding hemorrhoids may be the problem. The color and frequency of the bleeding are additional considerations. In most cases, the medical history provides clues, but seldom the final answer.  A visual and finger (digital) exam. Your caregiver will inspect the anal area, looking for tears and hemorrhoids.  A finger exam can provide information when there is tenderness or a growth inside. In men, the prostate is also examined.  Endoscopy. Several types of small, long scopes (endoscopes) are used to view the colon.  In the office, your caregiver may use a rigid, or more commonly, a flexible viewing sigmoidoscope. This exam is called flexible sigmoidoscopy. It is performed in 5 to 10 minutes.  A more thorough exam is accomplished with a colonoscope. It allows your caregiver to view the entire 5 to 6 foot long colon. Medicine to help you relax (sedative) is usually given for this exam. Frequently, a bleeding lesion may be present beyond the reach of the sigmoidoscope. So, a colonoscopy may be the best exam to start with. Both exams are usually  done on an outpatient basis. This means the patient does not stay overnight in the hospital or surgery center.  An upper endoscopy may be needed to examine your stomach. Sedation is used and a flexible endoscope is put in your mouth, down to your stomach.  A barium enema X-ray. This is an X-ray exam. It uses liquid barium inserted by enema into the rectum. This test alone may not identify an actual bleeding point. X-rays highlight abnormal shadows, such as those made by lumps (tumors), diverticuli, or colitis. TREATMENT  Treatment depends on the cause of your bleeding.   For bleeding from the stomach or colon, the caregiver doing your endoscopy or colonoscopy may be able to stop the bleeding as part of the procedure.  Inflammation or infection of the colon can be treated with medicines.  Many rectal problems can be treated with creams, suppositories, or warm baths.  Surgery is sometimes needed.  Blood transfusions are sometimes needed if you have lost a lot of blood.  For any bleeding problem, let your caregiver know if you take aspirin or other blood thinners regularly. HOME CARE INSTRUCTIONS   Take any medicines exactly as prescribed.  Keep your stools soft by eating a diet high in fiber. Prunes (1 to 3 a day) work well for many people.  Drink enough water and fluids to keep your urine clear or pale yellow.  Take sitz baths if advised. A sitz bath is when you sit in a bathtub with warm water for 10 to 15 minutes to soak, soothe, and cleanse the rectal area.  If enemas or suppositories are advised, be sure you know how to use them. Tell your caregiver if you have problems with this.  Monitor your bowel movements to look for signs of improvement or worsening. SEEK MEDICAL CARE IF:   You do not improve in the time expected.  Your condition worsens after initial improvement.  You develop any new symptoms. SEEK IMMEDIATE MEDICAL CARE IF:   You develop severe or prolonged rectal  bleeding.  You vomit blood.  You feel weak or faint.  You have a fever. MAKE SURE YOU:  Understand these instructions.  Will watch your condition.  Will get help right away if you are not doing well or get worse. Document Released: 02/05/2002 Document Revised: 05/10/2011 Document Reviewed: 07/03/2010 [facility] Patient Information 2015 [place], [place]. This information is not intended to replace advice given to you by your health care provider. Make sure you discuss any questions you have with your health care provider.

## 2014-04-07 NOTE — ED Notes (Signed)
C/o diarrhea x 3 days. States his children and his wife had vomiting but he did not.  C/o chills and fever 100.4 yesterday.  D x 4 today and states stools are black. Took Pepto Bismol Fri night and Sat. AM.  Has not eaten since Fri.

## 2014-04-07 NOTE — ED Provider Notes (Signed)
CSN: [id number]     Arrival date & time 04/07/14  1159 History   First MD Initiated Contact with Patient 04/07/14 1222     Chief Complaint  Patient presents with  . Diarrhea   (Consider location/radiation/quality/duration/timing/severity/associated sxs/prior Treatment)  HPI   Patient is a 39 year old male presenting today with complaints of chills and cold/flu symptoms with fever since this past Thursday. Patient states that his family has had a GI virus this past week with nausea and vomiting. Patient states he has had no vomiting but has had diarrhea with some nausea since this past Thursday evening. He describes his stool as watery and  "black in color". Significant medical history for an appendectomy as a child. Reports generalized abdominal pain and cramping with decreased appetite. Denies history of ulcerative disease.    Past Medical History  Diagnosis Date  . Shingles (herpes zoster) polyneuropathy 2008  . Depression    Past Surgical History  Procedure Laterality Date  . Tympanic membrane repair  1993  . Appendectomy  1994   Family History  Problem Relation Age of Onset  . Cancer Mother     breast cancer  . Diabetes Mother   . Varicose Veins Father    History  Substance Use Topics  . Smoking status: Never Smoker   . Smokeless tobacco: Not on file  . Alcohol Use: 3.6 oz/week    6 Cans of beer per week     Comment: occasional 3x/week    Review of Systems  Constitutional: Positive for fever and chills.  HENT: Negative.  Negative for sore throat.   Eyes: Negative.   Respiratory: Negative.  Negative for cough, chest tightness, shortness of breath and wheezing.   Cardiovascular: Negative.  Negative for chest pain.  Gastrointestinal: Positive for nausea, abdominal pain and diarrhea. Negative for vomiting, constipation, blood in stool, abdominal distention, anal bleeding and rectal pain.  Endocrine: Negative.   Genitourinary: Negative.  Negative for dysuria, frequency  and hematuria.  Musculoskeletal: Negative.   Skin: Negative.  Negative for color change, pallor, rash and wound.  Allergic/Immunologic: Negative.   Neurological: Negative.  Negative for dizziness and light-headedness.  Hematological: Negative.   Psychiatric/Behavioral: Negative.     Allergies  Compazine and Vyvanse  Home Medications   Prior to Admission medications   Medication Sig Start Date End Date Taking? Authorizing Provider  buPROPion (WELLBUTRIN XL) 150 MG 24 hr tablet Take 450 mg by mouth daily.    Yes Historical Provider, MD  Multiple Vitamin (MULTIVITAMIN WITH MINERALS) TABS Take 1 tablet by mouth daily.   Yes Historical Provider, MD  acyclovir (ZOVIRAX) 400 MG tablet Take 1 tablet (400 mg total) by mouth 5 (five) times daily. 12/30/11   Remi Haggard, NP  buPROPion (WELLBUTRIN XL) 300 MG 24 hr tablet Take 300 mg by mouth daily.    Historical Provider, MD  diclofenac (VOLTAREN) 75 MG EC tablet Take 1 tablet (75 mg total) by mouth 2 (two) times daily. 05/15/12   Reuben Likes, MD  diphenoxylate-atropine (LOMOTIL) 2.5-0.025 MG per tablet Take 1 tablet by mouth 4 (four) times daily as needed for diarrhea or loose stools. 04/07/14   Servando Salina, NP  erythromycin ophthalmic ointment Place into the right eye every 4 (four) hours. 12/30/11   Remi Haggard, NP  HYDROcodone-acetaminophen (NORCO/VICODIN) 5-325 MG per tablet 1 to 2 tabs every 4 to 6 hours as needed for pain. 05/15/12   Reuben Likes, MD  ibuprofen (ADVIL,MOTRIN) 800 MG tablet Take 800  mg by mouth every 8 (eight) hours as needed. FOR PAIN.    Historical Provider, MD  oxyCODONE-acetaminophen (PERCOCET/ROXICET) 5-325 MG per tablet Take 2 tablets by mouth every 4 (four) hours as needed for pain. 12/30/11   Remi HaggardAnne Crawford, NP  Oxymetazoline HCl (VISINE-LR OP) Apply 1 drop to eye as needed. FOR REDNESS    Historical Provider, MD   BP 120/86 mmHg  Pulse 86  Temp(Src) 98.1 F (36.7 C) (Oral)  Resp 18  SpO2 97%   Physical  Exam  Constitutional: He is oriented to person, place, and time. He appears well-developed and well-nourished. No distress.  HENT:  Head: Atraumatic.  Mouth/Throat: Oropharynx is clear and moist.  Cardiovascular: Normal rate, regular rhythm, normal heart sounds and intact distal pulses.  Exam reveals no gallop and no friction rub.   No murmur heard. Pulmonary/Chest: Effort normal and breath sounds normal. No respiratory distress. He has no wheezes. He has no rales. He exhibits no tenderness.  Abdominal: Soft. He exhibits no distension and no mass. There is no tenderness. There is no rebound and no guarding.  Scarring to right lower quadrant of abdomen consistent with appendectomy as a child. Hyperactive bowel sounds throughout in all 4 quadrants.  Neurological: He is alert and oriented to person, place, and time.  Skin: Skin is warm and dry. He is not diaphoretic.  Nursing note and vitals reviewed.  Patient initially refused rectal exam for Hemoccult. Stated would be able to produce stool on his own. Patient was unable to do so provider obtain specimen for card. No formed stool in rectal vault. Light green specimen obtained for testing.    ED Course  Procedures (including critical care time) Labs Review Labs Reviewed - No data to display  Imaging Review  HEMOCCULT : TRACE POSITIVE  MDM   1. Diarrhea, infectious, adult   2. Heme + stool    Meds ordered this encounter  Medications  . diphenoxylate-atropine (LOMOTIL) 2.5-0.025 MG per tablet    Sig: Take 1 tablet by mouth 4 (four) times daily as needed for diarrhea or loose stools.    Dispense:  30 tablet    Refill:  0   Patient was advised of importance of following up with primary care provider for Hemoccult testing 3. In addition patient states he utilizes ibuprofen greater than 3 times weekly. Advised to decrease use of NSAIDS immediately. The patient verbalizes understanding and agrees to plan of care.     Servando Salinaatherine H Rossi,  NP 04/07/14 1416

## 2014-04-08 LAB — OCCULT BLOOD, POC DEVICE: FECAL OCCULT BLD: POSITIVE — AB

## 2015-01-03 ENCOUNTER — Other Ambulatory Visit: Payer: Self-pay | Admitting: Sports Medicine

## 2015-01-03 DIAGNOSIS — M25561 Pain in right knee: Secondary | ICD-10-CM

## 2015-01-06 ENCOUNTER — Ambulatory Visit
Admission: RE | Admit: 2015-01-06 | Discharge: 2015-01-06 | Disposition: A | Discharge status: Home / Self Care | Payer: BLUE CROSS/BLUE SHIELD | Source: Ambulatory Visit | Attending: Sports Medicine | Admitting: Sports Medicine

## 2015-01-06 ENCOUNTER — Other Ambulatory Visit: Payer: BLUE CROSS/BLUE SHIELD

## 2015-01-06 DIAGNOSIS — M25561 Pain in right knee: Secondary | ICD-10-CM

## 2016-01-27 ENCOUNTER — Other Ambulatory Visit: Payer: Self-pay | Admitting: Sports Medicine

## 2016-01-27 NOTE — Telephone Encounter (Signed)
Rx refill request

## 2016-04-03 ENCOUNTER — Other Ambulatory Visit: Payer: Self-pay | Admitting: Sports Medicine

## 2016-07-10 ENCOUNTER — Other Ambulatory Visit: Payer: Self-pay | Admitting: Sports Medicine

## 2018-01-19 ENCOUNTER — Other Ambulatory Visit: Payer: Self-pay

## 2018-01-19 ENCOUNTER — Ambulatory Visit (HOSPITAL_COMMUNITY)
Admission: EM | Admit: 2018-01-19 | Discharge: 2018-01-19 | Disposition: A | Discharge status: Home / Self Care | Payer: BC Managed Care – PPO | Attending: Family Medicine | Admitting: Family Medicine

## 2018-01-19 ENCOUNTER — Encounter (HOSPITAL_COMMUNITY): Payer: Self-pay

## 2018-01-19 DIAGNOSIS — M62830 Muscle spasm of back: Secondary | ICD-10-CM | POA: Diagnosis not present

## 2018-01-19 DIAGNOSIS — M5442 Lumbago with sciatica, left side: Secondary | ICD-10-CM

## 2018-01-19 MED ORDER — METHYLPREDNISOLONE 4 MG PO TBPK: ORAL_TABLET | ORAL | 0 refills | Status: DC

## 2018-01-19 MED ORDER — HYDROCODONE-ACETAMINOPHEN 5-325 MG PO TABS: 1.0000 | ORAL_TABLET | ORAL | 0 refills | Status: DC | PRN

## 2018-01-19 MED ORDER — TIZANIDINE HCL 4 MG PO TABS: 4.0000 mg | ORAL_TABLET | Freq: Four times a day (QID) | ORAL | 0 refills | Status: DC | PRN

## 2018-01-19 NOTE — ED Triage Notes (Signed)
Pt states his lower back is hurting the pain is radiating down the left side. This started today.

## 2018-01-19 NOTE — Discharge Instructions (Signed)
Activity as tolerated Try ice or heat to the area Ice is usually better for acute muscle spasm Hold the etodolac for now Take the prednisone pack as directed Take all of day 1 today Take the relaxer as needed.  Caution drowsiness Take the pain medication Tylenol with hydrocodone as needed.  Caution drowsiness.  Caution constipation.  Do not take pain medicine and drive Follow-up if you are worse or fail to improve over the next several days

## 2018-01-19 NOTE — ED Provider Notes (Signed)
[facility]    CSN: [id number] Arrival date & time: 01/19/18  1101     History   Chief Complaint Chief Complaint  Patient presents with  . Back Pain    HPI Evan Romero is a 42 y.o. male.   HPI   Patient is here for acute back pain.  He states that he was feeling well yesterday.  He does not have an ongoing back condition.  He states that this morning he was standing at his desk, bent over to look at his email.  He moved very slightly to change position and had a sudden stabbing "electric shock" pain across his low back.  Ever since then he is been in severe pain with any movement.  The pain is in the left low back going down into his left buttock.  No numbness or weakness.  No bowel or bladder complaint.  No trauma or injury.  No overuse. I did look up his old x-rays done in 2010.  At that time he had L5-S1 disc narrowing with facet arthropathy.  He was unaware of this result, he does not have ongoing back problems.    Past Medical History:  Diagnosis Date  . Depression   . Shingles (herpes zoster) polyneuropathy 2008    Patient Active Problem List   Diagnosis Date Noted  . Varicose veins of leg with complications 12/03/2013  . Varicose veins of lower extremities with other complications 07/10/2013    Past Surgical History:  Procedure Laterality Date  . APPENDECTOMY  1994  . TYMPANIC MEMBRANE REPAIR  1993       Home Medications    Prior to Admission medications   Medication Sig Start Date End Date Taking? Authorizing Provider  buPROPion (WELLBUTRIN XL) 150 MG 24 hr tablet Take 450 mg by mouth daily.     [provider]  buPROPion (WELLBUTRIN XL) 300 MG 24 hr tablet Take 300 mg by mouth daily.    [provider]  HYDROcodone-acetaminophen (NORCO/VICODIN) 5-325 MG tablet Take 1-2 tablets by mouth every 4 (four) hours as needed. 01/19/18   Eustace MooreNelson, Tanor Glaspy Sue, MD  methylPREDNISolone (MEDROL DOSEPAK) 4 MG TBPK tablet tad 01/19/18    Eustace MooreNelson, Shivan Hodes Sue, MD  Multiple Vitamin (MULTIVITAMIN WITH MINERALS) TABS Take 1 tablet by mouth daily.    [provider]  Oxymetazoline HCl (VISINE-LR OP) Apply 1 drop to eye as needed. FOR REDNESS    [provider]  tiZANidine (ZANAFLEX) 4 MG tablet Take 1 tablet (4 mg total) by mouth every 6 (six) hours as needed for muscle spasms. 01/19/18   Eustace MooreNelson, Dejai Schubach Sue, MD    Family History Family History  Problem Relation Age of Onset  . Cancer Mother        breast cancer  . Diabetes Mother   . Varicose Veins Father     Social History Social History   Tobacco Use  . Smoking status: Never Smoker  . Smokeless tobacco: Never Used  Substance Use Topics  . Alcohol use: Yes    Alcohol/week: 6.0 standard drinks    Types: 6 Cans of beer per week    Comment: occasional 3x/week  . Drug use: No     Allergies   Compazine [prochlorperazine edisylate] and Vyvanse [lisdexamfetamine dimesylate]   Review of Systems Review of Systems  Constitutional: Negative for chills and fever.  HENT: Negative for ear pain and sore throat.   Eyes: Negative for pain and visual disturbance.  Respiratory: Negative for cough and  shortness of breath.   Cardiovascular: Negative for chest pain and palpitations.  Gastrointestinal: Negative for abdominal pain and vomiting.  Genitourinary: Negative for dysuria and hematuria.  Musculoskeletal: Positive for back pain. Negative for arthralgias.  Skin: Negative for color change and rash.  Neurological: Negative for seizures and syncope.  All other systems reviewed and are negative.    Physical Exam Triage Vital Signs ED Triage Vitals  Enc Vitals Group     BP 01/19/18 1206 126/76     Pulse Rate 01/19/18 1206 89     Resp 01/19/18 1206 18     Temp 01/19/18 1206 98 F (36.7 C)     Temp Source 01/19/18 1206 Oral     SpO2 01/19/18 1206 100 %     Weight 01/19/18 1203 248 lb (112.5 kg)     Height --      Head Circumference --      Peak  Flow --      Pain Score 01/19/18 1203 6     Pain Loc --      Pain Edu? --      Excl. in GC? --    No data found.  Updated Vital Signs BP 126/76 (BP Location: Left Arm)   Pulse 89   Temp 98 F (36.7 C) (Oral)   Resp 18   Wt 112.5 kg   SpO2 100%   BMI 33.63 kg/m       Physical Exam  Constitutional: He appears well-developed and well-nourished. No distress.  Stiff posture, acutely uncomfortable  HENT:  Head: Normocephalic and atraumatic.  Mouth/Throat: Oropharynx is clear and moist.  Eyes: Pupils are equal, round, and reactive to light. Conjunctivae are normal.  Neck: Normal range of motion.  Cardiovascular: Normal rate, regular rhythm, normal heart sounds and intact distal pulses.  Pulmonary/Chest: Effort normal and breath sounds normal. No respiratory distress.  Abdominal: Soft. Bowel sounds are normal. He exhibits no distension.  Musculoskeletal: Normal range of motion. He exhibits no edema.  Patient resists movement.  He appears acutely uncomfortable.  He has tenderness to palpation left lumbar column of muscles to the left buttock.  Mild increased muscle tone.  Very limited range of motion.  Strength sensation range of motion and reflexes are normal in both lower extremities.  He can stand on his heels and toes without difficulty.  No pain with axial loading.  Straight leg raise on the right causes increased left buttock pain.  Neurological: He is alert.  Skin: Skin is warm and dry.  Psychiatric: He has a normal mood and affect. His behavior is normal.     UC Treatments / Results  Labs (all labs ordered are listed, but only abnormal results are displayed) Labs Reviewed - No data to display  EKG None  Radiology No results found.  Procedures Procedures (including critical care time)  Medications Ordered in UC Medications - No data to display  Initial Impression / Assessment and Plan / UC Course  I have reviewed the triage vital signs and the nursing  notes.  Pertinent labs & imaging results that were available during my care of the patient were reviewed by me and considered in my medical decision making (see chart for details).     I showed the patient his old x-rays.  We discussed degenerative disc disease and conservative management of back pain.  I believe he does have some radicular pain but no neurologic findings. Final Clinical Impressions(s) / UC Diagnoses   Final diagnoses:  Muscle  spasm of back  Acute left-sided low back pain with left-sided sciatica     Discharge Instructions     Activity as tolerated Try ice or heat to the area Ice is usually better for acute muscle spasm Hold the etodolac for now Take the prednisone pack as directed Take all of day 1 today Take the relaxer as needed.  Caution drowsiness Take the pain medication Tylenol with hydrocodone as needed.  Caution drowsiness.  Caution constipation.  Do not take pain medicine and drive Follow-up if you are worse or fail to improve over the next several days   ED Prescriptions    Medication Sig Dispense Auth. Provider   methylPREDNISolone (MEDROL DOSEPAK) 4 MG TBPK tablet tad 21 tablet Eustace Moore, MD   tiZANidine (ZANAFLEX) 4 MG tablet Take 1 tablet (4 mg total) by mouth every 6 (six) hours as needed for muscle spasms. 30 tablet Eustace Moore, MD   HYDROcodone-acetaminophen (NORCO/VICODIN) 5-325 MG tablet Take 1-2 tablets by mouth every 4 (four) hours as needed. 12 tablet Eustace Moore, MD     Controlled Substance Prescriptions [place] Controlled Substance Registry consulted? Yes, I have consulted the [place] Controlled Substances Registry for this patient, and feel the risk/benefit ratio today is favorable for proceeding with this prescription for a controlled substance.   Eustace Moore, MD 01/19/18 [phone]

## 2019-12-13 ENCOUNTER — Emergency Department: Payer: BC Managed Care – PPO

## 2019-12-13 ENCOUNTER — Encounter: Payer: Self-pay | Admitting: Emergency Medicine

## 2019-12-13 ENCOUNTER — Other Ambulatory Visit: Payer: Self-pay

## 2019-12-13 ENCOUNTER — Emergency Department
Admission: EM | Admit: 2019-12-13 | Discharge: 2019-12-13 | Disposition: A | Discharge status: Home / Self Care | Payer: BC Managed Care – PPO | Attending: Emergency Medicine | Admitting: Emergency Medicine

## 2019-12-13 DIAGNOSIS — Z79899 Other long term (current) drug therapy: Secondary | ICD-10-CM | POA: Insufficient documentation

## 2019-12-13 DIAGNOSIS — Y92410 Unspecified street and highway as the place of occurrence of the external cause: Secondary | ICD-10-CM | POA: Insufficient documentation

## 2019-12-13 DIAGNOSIS — Y9389 Activity, other specified: Secondary | ICD-10-CM | POA: Insufficient documentation

## 2019-12-13 DIAGNOSIS — M25511 Pain in right shoulder: Secondary | ICD-10-CM | POA: Insufficient documentation

## 2019-12-13 MED ORDER — CYCLOBENZAPRINE HCL 5 MG PO TABS: 5.0000 mg | ORAL_TABLET | Freq: Three times a day (TID) | ORAL | 0 refills | Status: AC | PRN

## 2019-12-13 MED ORDER — KETOROLAC TROMETHAMINE 10 MG PO TABS: 10.0000 mg | ORAL_TABLET | Freq: Three times a day (TID) | ORAL | 0 refills | Status: AC

## 2019-12-13 MED ORDER — KETOROLAC TROMETHAMINE 30 MG/ML IJ SOLN
30.0000 mg | Freq: Once | INTRAMUSCULAR | Status: AC
  Administered 2019-12-13: 30 mg via INTRAMUSCULAR
  Filled 2019-12-13: qty 1

## 2019-12-13 MED ORDER — CYCLOBENZAPRINE HCL 10 MG PO TABS
10.0000 mg | ORAL_TABLET | Freq: Once | ORAL | Status: AC
  Administered 2019-12-13: 10 mg via ORAL
  Filled 2019-12-13: qty 1
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
## 2019-12-13 NOTE — Discharge Instructions (Addendum)
Your exam and x-rays are overall normal at this time.  No signs of acute chest wall injury or shoulder injury.  You may experience several days of muscle soreness and stiffness.  Take the medications as prescribed.  Apply ice, and/or moist heat to reduce symptoms.  Follow-up with your primary provider or local urgent care for ongoing symptoms.
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
